# Patient Record
Sex: Female | Born: 1970 | Race: Black or African American | Hispanic: No | Smoking: Never smoker
Health system: Southern US, Community
[De-identification: ages and names within clinical notes are randomized; demographics above are authoritative.]

## PROBLEM LIST (undated history)

## (undated) DIAGNOSIS — F32A Depression, unspecified: Secondary | ICD-10-CM

## (undated) DIAGNOSIS — T7840XA Allergy, unspecified, initial encounter: Secondary | ICD-10-CM

## (undated) DIAGNOSIS — B019 Varicella without complication: Secondary | ICD-10-CM

## (undated) DIAGNOSIS — F329 Major depressive disorder, single episode, unspecified: Secondary | ICD-10-CM

## (undated) HISTORY — DX: Major depressive disorder, single episode, unspecified: F32.9

## (undated) HISTORY — DX: Allergy, unspecified, initial encounter: T78.40XA

## (undated) HISTORY — DX: Varicella without complication: B01.9

## (undated) HISTORY — DX: Depression, unspecified: F32.A

---

## 1997-12-03 ENCOUNTER — Ambulatory Visit (HOSPITAL_COMMUNITY): Admission: RE | Admit: 1997-12-03 | Discharge: 1997-12-03 | Payer: Self-pay | Admitting: Obstetrics and Gynecology

## 1998-07-01 ENCOUNTER — Encounter: Payer: Self-pay | Admitting: Obstetrics and Gynecology

## 1998-07-01 ENCOUNTER — Ambulatory Visit (HOSPITAL_COMMUNITY): Admission: RE | Admit: 1998-07-01 | Discharge: 1998-07-01 | Payer: Self-pay | Admitting: Obstetrics and Gynecology

## 1999-09-15 ENCOUNTER — Other Ambulatory Visit: Admission: RE | Admit: 1999-09-15 | Discharge: 1999-09-15 | Payer: Self-pay | Admitting: *Deleted

## 2000-09-28 ENCOUNTER — Other Ambulatory Visit: Admission: RE | Admit: 2000-09-28 | Discharge: 2000-09-28 | Payer: Self-pay | Admitting: Obstetrics and Gynecology

## 2003-06-13 ENCOUNTER — Other Ambulatory Visit: Admission: RE | Admit: 2003-06-13 | Discharge: 2003-06-13 | Payer: Self-pay | Admitting: Obstetrics and Gynecology

## 2007-06-19 ENCOUNTER — Ambulatory Visit (HOSPITAL_COMMUNITY): Admission: RE | Admit: 2007-06-19 | Discharge: 2007-06-19 | Payer: Self-pay | Admitting: Obstetrics and Gynecology

## 2007-06-19 ENCOUNTER — Encounter (INDEPENDENT_AMBULATORY_CARE_PROVIDER_SITE_OTHER): Payer: Self-pay | Admitting: Obstetrics and Gynecology

## 2010-06-21 ENCOUNTER — Encounter: Payer: Self-pay | Admitting: Internal Medicine

## 2010-10-13 NOTE — Op Note (Signed)
[name], [name]                 ACCOUNT NO.:  [id number]   MEDICAL RECORD NO.:  [id number]          PATIENT TYPE:  AMB   LOCATION:  SDC                           FACILITY:  [facility]   PHYSICIAN:  Dois Davenport A. Robertson, M.D. DATE OF BIRTH:  [birth year]   DATE OF PROCEDURE:  06/19/2007  DATE OF DISCHARGE:                               OPERATIVE REPORT   PREOPERATIVE DIAGNOSIS:  Missed abortion.   POSTOPERATIVE DIAGNOSIS:  Missed abortion.   ANESTHESIA:  IV sedation and paracervical block, Dr. Arby Barrette.   PROCEDURE:  D and E, Dr. Estanislado Pandy.   ESTIMATED BLOOD LOSS:  Minimal.   PROCEDURE:  After being informed of the planned procedure, with possible  complications including bleeding, infection, and injury to uterus,  informed consent was obtained.  The patient was taken to O.R. #7 and  given IV sedation, placed in the lithotomy position.  She is prepped and  draped in a sterile fashion. and her bladder is emptied with an in-and-  out Foley catheter.   GYN exam reveals an anteverted uterus, about 10 weeks in size, and two  normal adnexa.  A speculum is inserted.  Anterior lip of the cervix was  grasped with a tenaculum forceps, and we proceed with a paracervical  block using Novocain 1%. 20 mL, in the usual fashion.  Uterus is sounded  at 8 cm, and the cervix is easily dilated using Hegar dilator until #31,  which allows easy entry of a #8 curved cannula.  We proceed with  evacuation of products of conception without complication.  After  evacuation is completed, a sharp curette is used to assess the uterine  walls which are felt to be free of tissue, including both cornua.  Instruments are then removed.  Instrument and sponge count is complete  x2.  Estimated blood loss is minimal, and the procedure is very well  tolerated by the patient, who is taken to recovery room and discharged  home in a well and stable condition.   SPECIMEN:  Products of conception, sent to pathology.      [name], M.D.  Electronically Signed     SAR/MEDQ  D:  06/19/2007  T:  06/19/2007  Job:  [id number]

## 2010-10-13 NOTE — H&P (Signed)
[name], [name]                 ACCOUNT NO.:  [id number]   MEDICAL RECORD NO.:  [id number]          PATIENT TYPE:  AMB   LOCATION:                                FACILITY:  [facility]   PHYSICIAN:  Crist Fat. Rivard, M.D. DATE OF BIRTH:  16-Mar-1971   DATE OF ADMISSION:  06/19/2007  DATE OF DISCHARGE:                              HISTORY & PHYSICAL   REFERRING PHYSICIAN:  Dois Davenport A. Rivard, M.D.   At the time of this dictation, the patient's medical record number at  [facility] is unknown.   HISTORY:  The patient is a 40 year old married white female,  primigravida, who presents on date of admission for a scheduled D&E  secondary to a missed abortion.  The patient was seen on 06/15/2007 in  the office for her new OB interview, and as well for a new OB workup.  During her new OB interview, she reported that she had not had any  vaginal bleeding, no leakage of fluid.  She had not had any nausea,  vomiting or diarrhea.  She had had some mild cramping, which was  relieved with Tylenol and did report occasional dizziness.  During her  new OB workup, an ultrasound was obtained secondary to patient's history  of PCOS, dysfunctional uterine bleeding and irregular cycles to confirm  dating of pregnancy and the ultrasound showed no fetal heart tones with  a crown-rump length measuring 8 and 1/[redacted] weeks gestation.  Per patient's  recall, her last menstrual period was 04/09/2007, which gave her a due  date of 01/16/2008 and at the time on January 15th, she was supposed to  be approximately 9 and 4/[redacted] weeks gestation.  Prenatal labs had been  drawn, but were all cancelled with the exception of ABO, RH and a CBC.  The results of her ultrasound were discussed with patient and her  husband, and her options regarding missed abortion management including  expected management, medical induction utilizing Cytotec and a D&E were  discussed with the patient.  Risks, benefits and alternatives were  reviewed  in depth and after conversation the patient did desire to  proceed with a D&E with Dois Davenport A. Rivard, M.D.   PAST MEDICAL HISTORY:  The patient's history is remarkable for:  1. PCOS.  2. Irregular cycles with amenorrhea and dysfunctional uterine      bleeding.  3. History of cryosurgery in early 1990s with subsequent normal pap      smears.  4. Irritable bowel syndrome with constipation primarily.  5. She has had multiple yeast infections.  6. To note, her mother passed away of breast cancer at the age of 32.   PRENATAL LABS:  As was mentioned, her labs were cancelled with the  exception of ABO, RH and a CBC, and both remain pending at the time of  this dictation.   OBSTETRIC HISTORY:  The patient is a primigravida.   MEDICAL HISTORY:  The patient reports the use of birth control pills as  well as condoms for contraception in the past.  As was noted, she has a  history  of PCOS, irregular cycles with amenorrhea and dysfunctional  uterine bleeding, and has most recently been utilizing Provera for  induction of menses.  She did report, at the time of her new OB visit,  that her last cycle prior to conception, she did not necessitate Provera  for onset of menses.  She reports cryosurgery in the early 1990s, and  her last Pap smear was June of 2008, and was normal.  She has had  multiple yeast infections in the past.  She reports normal childhood  illnesses including chickenpox as a child, and feels certain per her  history of receiving Hepatitis B vaccines.  She has irritable bowel  syndrome with primarily constipation.   SURGICAL HISTORY:  Cryosurgery.   FAMILY HISTORY:  She reports her maternal grandmother and her three  brothers all have high cholesterol.  She has a paternal uncle with  chronic hypertension, a maternal grandfather with diabetes, maternal  grandmother with thyroid dysfunction, paternal grandmother lung cancer,  father with prostate cancer.  She reports a first  cousin who has chronic  renal disease and is on dialysis.  Maternal grandmother and maternal  grandfather with dementia.  She did report that her mother had breast  cancer before menopause.  She was diagnosed sometime between age 62-38  and passed away subsequently at age 8.  The patient did report a  mammogram at approximately 40 years of age, which was within normal  limits.   GENETIC HISTORY:  Unremarkable with the exception of the patient is 40  years of age, which is considered advanced maternal age.  The father of  the baby is 69 years of age.   ALLERGIES:  The patient does report a latex allergy, which causes  breakouts.  She also reports some metal allergies which also cause a  rash and breakout.  She denied any medication allergies.   SOCIAL HISTORY:  The patient is married black female.  She and her  husband are of [place] faith.  The patient has a Oncologist and  works at [employer] as a Psychologist, sport and exercise time.  Her husband has a  high school degree and works as an Designer, television/film set.  She denied any alcohol,  tobacco, or illicit drug use.   PHYSICAL EXAMINATION:  VITAL SIGNS:  Stable. Patient's blood pressure on  January 15th was 100/60, she was afebrile, her pulse was 60 as well.  Her height is 5 foot 1-1/2 inches, weight on January 15th was 125  pounds.  She has a normal body mass index.  SKIN:  Within normal limits, it was warm, dry and intact.  HEENT:  Grossly intact and within normal limits.  NECK/THYROID:  Normal.  HEART:  Regular rate and rhythm without murmur.  LUNGS:  Clear to auscultation bilaterally.  BREASTS:  Slightly tender, soft, no abnormal discharge or palpable  masses.  ABDOMEN:  Soft and nontender, no organomegaly.  PELVIC:  External genitalia without lesions or abnormalities.  BIMANUAL:  Cervix was closed and long, minimal discharge noted, no  bleeding, no internal lesions noted.  Uterus was approximately nine  weeks size on palpation, unable  to palpate adnexa.  RECTAL:  Deferred.  EXTREMITIES:  Deep tendon reflexes are 2+ without clonus.  Patient  without edema.   IMPRESSION:  1. Missed ab.  2. Patient desiring D&E for management of missed ab.  3. Pending ABO, RH and CBC.   PLAN:  1. Admit to [facility] for consult with Crist Fat.  Rivard, M.D. as attending physician on date of admission.  2. Routine physician preoperative orders.  3. Risks and benefits of dilatation and evacuation were reviewed with      the patient by Dois Davenport A. Rivard, M.D. and the patient does desire      to proceed with  procedure for management of missed ab.  4. Support offered p.r.n.      Candice Rexford, CNM      Dois Davenport A. Rivard, M.D.  Electronically Signed    CHS/MEDQ  D:  06/16/2007  T:  06/16/2007  Job:  [id number]

## 2010-12-18 ENCOUNTER — Other Ambulatory Visit: Payer: Self-pay | Admitting: Obstetrics and Gynecology

## 2010-12-18 DIAGNOSIS — Z1231 Encounter for screening mammogram for malignant neoplasm of breast: Secondary | ICD-10-CM

## 2010-12-22 ENCOUNTER — Ambulatory Visit
Admission: RE | Admit: 2010-12-22 | Discharge: 2010-12-22 | Disposition: A | Discharge status: Home / Self Care | Payer: BC Managed Care – PPO | Source: Ambulatory Visit | Attending: Obstetrics and Gynecology | Admitting: Obstetrics and Gynecology

## 2010-12-22 DIAGNOSIS — Z1231 Encounter for screening mammogram for malignant neoplasm of breast: Secondary | ICD-10-CM

## 2011-02-19 LAB — CBC
HCT: 40.6
Hemoglobin: 14.5
MCHC: 35.8
MCV: 94.1
Platelets: 258
RBC: 4.31
RDW: 13.4
WBC: 4.3

## 2013-03-15 LAB — HM PAP SMEAR: HM PAP: NORMAL

## 2014-02-26 ENCOUNTER — Other Ambulatory Visit: Payer: Self-pay

## 2014-02-26 DIAGNOSIS — Z1231 Encounter for screening mammogram for malignant neoplasm of breast: Secondary | ICD-10-CM

## 2014-03-12 ENCOUNTER — Encounter (INDEPENDENT_AMBULATORY_CARE_PROVIDER_SITE_OTHER): Payer: Self-pay

## 2014-03-12 ENCOUNTER — Ambulatory Visit
Admission: RE | Admit: 2014-03-12 | Discharge: 2014-03-12 | Disposition: A | Discharge status: Home / Self Care | Payer: 59 | Source: Ambulatory Visit

## 2014-03-12 DIAGNOSIS — Z1231 Encounter for screening mammogram for malignant neoplasm of breast: Secondary | ICD-10-CM

## 2014-03-15 ENCOUNTER — Ambulatory Visit: Payer: BC Managed Care – PPO | Admitting: Family Medicine

## 2014-03-15 ENCOUNTER — Ambulatory Visit (INDEPENDENT_AMBULATORY_CARE_PROVIDER_SITE_OTHER): Payer: 59 | Admitting: Family Medicine

## 2014-03-15 ENCOUNTER — Encounter: Payer: Self-pay | Admitting: Family Medicine

## 2014-03-15 VITALS — BP 110/80 | Temp 98.1°F | Ht 62.0 in | Wt 144.0 lb

## 2014-03-15 DIAGNOSIS — M549 Dorsalgia, unspecified: Secondary | ICD-10-CM | POA: Insufficient documentation

## 2014-03-15 DIAGNOSIS — Z0000 Encounter for general adult medical examination without abnormal findings: Secondary | ICD-10-CM

## 2014-03-15 DIAGNOSIS — F32A Depression, unspecified: Secondary | ICD-10-CM

## 2014-03-15 DIAGNOSIS — Z7689 Persons encountering health services in other specified circumstances: Secondary | ICD-10-CM

## 2014-03-15 DIAGNOSIS — M546 Pain in thoracic spine: Secondary | ICD-10-CM

## 2014-03-15 DIAGNOSIS — F329 Major depressive disorder, single episode, unspecified: Secondary | ICD-10-CM | POA: Insufficient documentation

## 2014-03-15 DIAGNOSIS — Z7189 Other specified counseling: Secondary | ICD-10-CM

## 2014-03-15 MED ORDER — TIZANIDINE HCL 2 MG PO TABS: 2.0000 mg | ORAL_TABLET | Freq: Every day | ORAL | Status: DC

## 2014-03-15 NOTE — Patient Instructions (Signed)
-  get counseling  -schedule lab appointment for fasting labs  -do the exercises at least 4 days per week for the back  -consider massage therapy  -heat for 15 minutes twice daily   -muscle relaxer at night for a few days  -We have ordered labs or studies at this visit. It can take up to 1-2 weeks for results and processing. We will contact you with instructions IF your results are abnormal. Normal results will be released to your [facility]. If you have not heard from us or can not find your results in [facility] in 2 weeks please contact our office.  -PLEASE SIGN UP FOR MYCHART TODAY   We recommend the following healthy lifestyle measures: - eat a healthy diet consisting of lots of vegetables, fruits, beans, nuts, seeds, healthy meats such as white chicken and fish and whole grains.  - avoid fried foods, fast food, processed foods, sodas, red meet and other fattening foods.  - get a least 150 minutes of aerobic exercise per week.   Follow up in: 6 weeks or sooner if needed

## 2014-03-15 NOTE — Progress Notes (Signed)
Pre visit review using our clinic review tool, if applicable. No additional management support is needed unless otherwise documented below in the visit note. 

## 2014-03-15 NOTE — Progress Notes (Signed)
Chief Complaint  Patient presents with  . Establish Care    HPI:  [name] is here to establish care. Needs preventive visit here and labs for work. Last PCP and physical: [facility]; UTD on physical  Has the following chronic problems and concerns today:  Patient Active Problem List   Diagnosis Date Noted  . Depression 03/15/2014  . Upper back pain on right side 03/15/2014   R upper back and neck pain: -started bout 2 weeks ago -can't think of inciting event, but thinks slept wrong -aleve resolved it, but recurring -pain is intermittent, achy, 6/10 -worse with tension and lifting shoulders, not really better with anything -denies: weakness, numbness  Depression: Depressed mood for > 6 months - stress and home and at work; no abuse, feels safe Depression Symptoms: Sleep disorder: no Interest deficit/anhedonia: yes Guilt (worthlessness, hopelessness, regret):sometimes Energy deficit: yes Concentration deficit:yes Appetite disorder: no Psychomotor retardation or agitation: yes Suicidality: no  Doing pap, mammo breast health with gyn. Mother with breast cancer - followed by gyn for this. She has discussed genetic counseling with gyn - she is considering.  ROS negative for unless reported above: fevers, unintentional weight loss, hearing or vision loss, chest pain, palpitations, struggling to breath, hemoptysis, melena, hematochezia, hematuria, falls, loc, si, thoughts of self harm  Past Medical History  Diagnosis Date  . Chicken pox   . Depression     no medications in the past  . Allergy     History reviewed. No pertinent past surgical history.  Family History  Problem Relation Age of Onset  . Cancer Mother [id number]    breast cancer  . Cancer Father     prostate    History   Social History  . Marital Status: Married    Spouse Name: N/A    Number of Children: N/A  . Years of Education: N/A   Social History Main Topics  . Smoking  status: Never Smoker   . Smokeless tobacco: None  . Alcohol Use: No  . Drug Use: No  . Sexual Activity: None   Other Topics Concern  . 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  GENERAL: vitals reviewed and listed above, alert, oriented, appears well hydrated and in no acute distress  HEENT: atraumatic, conjunttiva clear, no obvious abnormalities on inspection of external nose and ears  NECK: no obvious masses on inspection  LUNGS: clear to auscultation bilaterally, no wheezes, rales or rhonchi, good air movement  CV: HRRR, no peripheral edema  GU/BREAST: declined - does with gyn  MS: moves all extremities without noticeable abnormality Normal inspection of head, neck and shoulders with normal ROM of head and neck. TTP and tension in R trap and  sub occipital muscles Nep spurling, no bony TTP, normal UE strength and sensation  PSYCH: pleasant and cooperative, depressed mood  ASSESSMENT AND PLAN:  Discussed the following assessment and plan:  Visit for preventive health examination -all USPSTF rec level a and b discussed -gyn and breast health is done with her gynecologist -fasting lab orders placed  Upper back pain on right side - Plan: Lipid Panel, Hemoglobin A1c, Basic metabolic panel, tiZANidine (ZANAFLEX) 2 MG tablet -we discussed possible serious and likely etiologies, workup and treatment,  treatment risks and return precautions -after this discussion, [name] opted for HEP and conservative care -follow up advised in 4-6 weeks -of course, we advised [name] or notify a doctor immediately if symptoms worsen or persist or new concerns arise.  Depression- discussed tx, she does not want to take a medication, start with counseling and exercise, follow up in 4-6 weeks or sooner if worsening or concerns  Encounter to establish care -We reviewed the PMH, PSH, FH, SH, Meds and Allergies. -We provided refills for any medications we will prescribe as needed. -We addressed current concerns per orders and patient instructions. -We have asked for records for pertinent exams, studies, vaccines and notes from previous providers. -We have advised patient to follow up per instructions below.   -Patient advised to return or notify a doctor immediately if symptoms worsen or persist or new concerns arise.  Patient Instructions  -get counseling  -schedule lab appointment for fasting labs  -do the exercises at least 4 days per week for the back  -consider massage therapy  -heat for 15 minutes twice daily   -muscle relaxer at night for a few days  -We have ordered labs or studies at this visit. It can take up to 1-2 weeks for results and processing. We will contact you with instructions IF your results are abnormal. Normal results will be released to your [facility]. If you have not heard from us or can not find your results in [facility] in 2 weeks please contact our office.  -PLEASE SIGN UP FOR MYCHART TODAY   We recommend the following healthy lifestyle measures: - eat a healthy diet consisting of lots of vegetables, fruits, beans, nuts, seeds, healthy meats such as white chicken and fish and whole grains.  - avoid fried foods, fast food, processed foods, sodas, red meet and other fattening foods.  - get a least 150 minutes of aerobic exercise per week.   Follow up in: 6 weeks or  sooner if needed      Duff Pozzi R.

## 2014-03-19 ENCOUNTER — Other Ambulatory Visit (INDEPENDENT_AMBULATORY_CARE_PROVIDER_SITE_OTHER): Payer: 59

## 2014-03-19 ENCOUNTER — Telehealth: Payer: Self-pay | Admitting: Family Medicine

## 2014-03-19 DIAGNOSIS — M549 Dorsalgia, unspecified: Secondary | ICD-10-CM

## 2014-03-19 DIAGNOSIS — M546 Pain in thoracic spine: Secondary | ICD-10-CM

## 2014-03-19 LAB — BASIC METABOLIC PANEL
BUN: 11 mg/dL (ref 6–23)
CHLORIDE: 105 meq/L (ref 96–112)
CO2: 22 mEq/L (ref 19–32)
Calcium: 9.1 mg/dL (ref 8.4–10.5)
Creatinine, Ser: 0.7 mg/dL (ref 0.4–1.2)
GFR: 119.91 mL/min (ref >60.00)
Glucose, Bld: 85 mg/dL (ref 70–99)
POTASSIUM: 4.6 meq/L (ref 3.5–5.1)
SODIUM: 140 meq/L (ref 135–145)

## 2014-03-19 LAB — LIPID PANEL
CHOL/HDL RATIO: 5
Cholesterol: 191 mg/dL (ref 0–200)
HDL: 38.6 mg/dL — AB (ref >39.00)
LDL Cholesterol: 142 mg/dL — ABNORMAL HIGH (ref 0–99)
NonHDL: 152.4
Triglycerides: 54 mg/dL (ref 0.0–149.0)
VLDL: 10.8 mg/dL (ref 0.0–40.0)

## 2014-03-19 LAB — HEMOGLOBIN A1C: Hgb A1c MFr Bld: 5.4 % (ref 4.6–6.5)

## 2014-03-19 MED ORDER — NAPROXEN 500 MG PO TABS: 500.0000 mg | ORAL_TABLET | Freq: Two times a day (BID) | ORAL | Status: DC

## 2014-03-19 NOTE — Telephone Encounter (Signed)
Patient came in this morning and stated that the prescription (Flexril) she received on Friday was not strong enough for her. Patient [facility] to know [facility] [address]

## 2014-03-19 NOTE — Telephone Encounter (Signed)
This type of pain usually take several weeks to resolve with the exercises. Can try tylenol 500-1000mg  up to 3 times daily or can rx naproxen 500mg  bid to try if she wants. Along with heat and topical sports creams this can help. The muscle relaxer is more to help with the muscle spasm to take at night before bed.

## 2014-03-19 NOTE — Telephone Encounter (Signed)
Patient informed of the message below and requests to take Naproxen and she is aware this was sent to her pharmacy.

## 2014-03-19 NOTE — Telephone Encounter (Signed)
Pt is calling back concerning flexril

## 2014-03-22 ENCOUNTER — Encounter: Payer: Self-pay | Admitting: *Deleted

## 2014-04-01 ENCOUNTER — Encounter: Payer: Self-pay | Admitting: Family Medicine

## 2014-05-15 ENCOUNTER — Emergency Department (HOSPITAL_COMMUNITY)
Admission: EM | Admit: 2014-05-15 | Discharge: 2014-05-15 | Discharge status: Absent without leave | Payer: 59 | Source: Home / Self Care

## 2016-04-06 ENCOUNTER — Other Ambulatory Visit: Payer: Self-pay | Admitting: Obstetrics and Gynecology

## 2016-04-06 DIAGNOSIS — Z1231 Encounter for screening mammogram for malignant neoplasm of breast: Secondary | ICD-10-CM

## 2016-04-08 ENCOUNTER — Ambulatory Visit
Admission: RE | Admit: 2016-04-08 | Discharge: 2016-04-08 | Disposition: A | Discharge status: Home / Self Care | Payer: Commercial Managed Care - PPO | Source: Ambulatory Visit | Attending: Obstetrics and Gynecology | Admitting: Obstetrics and Gynecology

## 2016-04-08 DIAGNOSIS — Z1231 Encounter for screening mammogram for malignant neoplasm of breast: Secondary | ICD-10-CM

## 2016-04-09 ENCOUNTER — Other Ambulatory Visit: Payer: Self-pay | Admitting: Obstetrics and Gynecology

## 2016-04-09 DIAGNOSIS — N63 Unspecified lump in unspecified breast: Secondary | ICD-10-CM

## 2016-04-20 ENCOUNTER — Ambulatory Visit
Admission: RE | Admit: 2016-04-20 | Discharge: 2016-04-20 | Disposition: A | Discharge status: Home / Self Care | Payer: Commercial Managed Care - PPO | Source: Ambulatory Visit | Attending: Obstetrics and Gynecology | Admitting: Obstetrics and Gynecology

## 2016-04-20 DIAGNOSIS — N63 Unspecified lump in unspecified breast: Secondary | ICD-10-CM

## 2018-02-12 ENCOUNTER — Encounter (HOSPITAL_COMMUNITY): Payer: Self-pay | Admitting: Emergency Medicine

## 2018-02-12 ENCOUNTER — Ambulatory Visit (HOSPITAL_COMMUNITY)
Admission: EM | Admit: 2018-02-12 | Discharge: 2018-02-12 | Disposition: A | Discharge status: Home / Self Care | Payer: Commercial Managed Care - PPO | Attending: Family Medicine | Admitting: Family Medicine

## 2018-02-12 ENCOUNTER — Other Ambulatory Visit: Payer: Self-pay

## 2018-02-12 DIAGNOSIS — L309 Dermatitis, unspecified: Secondary | ICD-10-CM

## 2018-02-12 MED ORDER — HYDROXYZINE HCL 25 MG PO TABS: 25.0000 mg | ORAL_TABLET | Freq: Four times a day (QID) | ORAL | 0 refills | Status: DC

## 2018-02-12 MED ORDER — METHYLPREDNISOLONE SODIUM SUCC 125 MG IJ SOLR
125.0000 mg | Freq: Once | INTRAMUSCULAR | Status: AC
  Administered 2018-02-12: 125 mg via INTRAMUSCULAR

## 2018-02-12 MED ORDER — PREDNISONE 20 MG PO TABS: ORAL_TABLET | ORAL | 0 refills | Status: DC

## 2018-02-12 MED ORDER — METHYLPREDNISOLONE SODIUM SUCC 125 MG IJ SOLR
INTRAMUSCULAR | Status: AC
  Filled 2018-02-12: qty 2

## 2018-02-12 MED ORDER — CLOBETASOL PROPIONATE 0.05 % EX OINT: 1.0000 "application " | TOPICAL_OINTMENT | Freq: Two times a day (BID) | CUTANEOUS | 0 refills | Status: DC

## 2018-02-12 NOTE — Discharge Instructions (Addendum)
I have placed you on a prednisone taper.  Avoid taking any naproxen and/or ibuprofen while taking prednisone.  I have also prescribed you hydroxyzine 25 mg may take as needed up to 3 times a day for itching.  Caution medication causes drowsiness. For topical therapy, I have prescribed clobetasol ointment twice daily to areas of irritation.

## 2018-02-12 NOTE — ED Triage Notes (Signed)
Pt reports recurrent eczema and this last flare up occurred in July and she is unable to treat it at home like she usually does.

## 2018-02-12 NOTE — ED Notes (Signed)
Pt complains of ringing in her ears and feeling very hot.  Joaquin CourtsKimberly Harris, NP notified and went to the pt's room.  Pt was given a cup of ice and she is sitting comfortably in the chair.  Pt states she is already feeling better.  WE will keep the pt for 5-10 more minutes and retake her BP before d/c.

## 2018-02-12 NOTE — ED Provider Notes (Addendum)
[facility]    CSN: [id number] Arrival date & time: 02/12/18  1520     History   Chief Complaint Chief Complaint  Patient presents with  . Eczema   HPI [name] is a 47 y.o. female.   Eczema Flare-up Patient presents with a known history of well-controlled eczema, is currently having an active flare affecting bilateral extremities, back, and lower extremities. She describes skin as rough, plaque-like lesions, pruritic lesions which have erupted on the aforementioned locations of her body.  She has attempted relief with her previously prescribed topical cream without significant relief.  Think she was previously prescribed betamethasone topical steroidal cream. In spite of application she continues to itch and rash continues to erupt on various parts of her body. Past Medical History:  Diagnosis Date  . Allergy   . Chicken pox   . Depression    no medications in the past    Patient Active Problem List   Diagnosis Date Noted  . Depression 03/15/2014  . Upper back pain on right side 03/15/2014    History reviewed. No pertinent surgical history.  OB History    Gravida  1   Para  0   Term      Preterm      AB      Living        SAB      TAB      Ectopic      Multiple      Live Births               Home Medications    Prior to Admission medications   Medication Sig Start Date End Date Taking? Authorizing Provider  naproxen (NAPROSYN) 500 MG tablet Take 1 tablet (500 mg total) by mouth 2 (two) times daily with a meal. 03/19/14   Terressa Koyanagi, DO  naproxen sodium (ANAPROX) 220 MG tablet Take 220 mg by mouth 2 (two) times daily with a meal.    [provider]  tiZANidine (ZANAFLEX) 2 MG tablet Take 1 tablet (2 mg total) by mouth at bedtime. 03/15/14   Terressa Koyanagi, DO    Family History Family History  Problem Relation Age of Onset  . Cancer Mother 11       breast cancer  . Cancer Father        prostate    Social  History Social History   Tobacco Use  . Smoking status: Never Smoker  . Smokeless tobacco: Never Used  Substance Use Topics  . Alcohol use: No  . Drug use: No     Allergies   Patient has no known allergies.   Review of Systems Review of Systems Pertinent negatives listed in HPI Physical Exam Triage Vital Signs ED Triage Vitals  Enc Vitals Group     BP 02/12/18 1555 112/70     Pulse Rate 02/12/18 1555 81     Resp --      Temp 02/12/18 1555 98.5 F (36.9 C)     Temp Source 02/12/18 1555 Oral     SpO2 02/12/18 1555 100 %     Weight --      Height --      Head Circumference --      Peak Flow --      Pain Score 02/12/18 1556 0     Pain Loc --      Pain Edu? --      Excl. in GC? --  No data found.  Updated Vital Signs BP 112/70 (BP Location: Left Arm)   Pulse 81   Temp 98.5 F (36.9 C) (Oral)   LMP 01/22/2018   SpO2 100%   Visual Acuity Right Eye Distance:   Left Eye Distance:   Bilateral Distance:    Right Eye Near:   Left Eye Near:    Bilateral Near:     Physical Exam   UC Treatments / Results  Labs (all labs ordered are listed, but only abnormal results are displayed) Labs Reviewed - No data to display  EKG None  Radiology No results found.  Procedures Procedures (including critical care time)  Medications Ordered in UC Medications  methylPREDNISolone sodium succinate (SOLU-MEDROL) 125 mg/2 mL injection 125 mg (125 mg Intramuscular Given 02/12/18 1709)   Initial Impression / Assessment and Plan / UC Course  I have reviewed the triage vital signs and the nursing notes.  Pertinent labs & imaging results that were available during my care of the patient were reviewed by me and considered in my medical decision making (see chart for details).    Patient presents today with an acute eczema exacerbation.  Eczema most of the time is very well controlled on reports her last exacerbation was well over 2 years ago.  No recent changes or  stressors to her knowledge.  He has tried to resolve current exacerbation at home with an older prescription of betamethasone which has not to be effective.  Therefore I will opt to treat her with an oral prednisone taper in which she will start tomorrow.  In office today I will opt to give her Solu-Medrol 125 mg IM facilitate decrease in current episode of inflammation.  She is also being prescribed hydroxyzine 25 mg up to 3 times a day as needed (caution provided regarding sick date of effects of medication).  She did not receive adequate relief with use of topical  Betamethasone, therefore will trial clobetasol topical therapy twice daily x10 days.  Advised if current eruption did not solve effectively to follow-up with PCP for further treatment and/or a referral to dermatology.  Patient verbalized understanding and agreement with plan.   After receiving Solu-Medrol 125 IM patient reported that she experienced hot flushing sensation and tinnitus. Vital signs were taken immediately, patient evaluated by me. There was no evidence of an allergic reaction or any distress. Patient was given ice and offered hydration however declined hydration and opted for ice chips. She was asked to remain in office an additional 20 minutes and no longer experienced any symptoms.  She was discharged stable without distress. Final Clinical Impressions(s) / UC Diagnoses   Final diagnoses:  Eczema, unspecified type     Discharge Instructions     I have placed you on a prednisone taper.  Avoid taking any naproxen and/or ibuprofen while taking prednisone.  I have also prescribed you hydroxyzine 25 mg may take as needed up to 3 times a day for itching.  Caution medication causes drowsiness. For topical therapy, I have prescribed clobetasol ointment twice daily to areas of irritation.    ED Prescriptions    Medication Sig Dispense Auth. Provider   predniSONE (DELTASONE) 20 MG tablet Take 3 PO QAM x3days, 2 PO QAM x3days,  1 PO QAM x3days 18 tablet Bing NeighborsHarris, Darcy Cordner S, FNP   hydrOXYzine (ATARAX/VISTARIL) 25 MG tablet Take 1 tablet (25 mg total) by mouth every 6 (six) hours. 12 tablet Bing NeighborsHarris, Dimitra Woodstock S, FNP   clobetasol ointment (TEMOVATE) 0.05 %  Apply 1 application topically 2 (two) times daily. 120 g Bing Neighbors, FNP     Controlled Substance Prescriptions [place] Controlled Substance Registry consulted? Not Applicable   Bing Neighbors, FNP 02/13/18 1520    Bing Neighbors, [place] 02/13/18 1521

## 2018-02-15 ENCOUNTER — Telehealth (HOSPITAL_COMMUNITY): Payer: Self-pay | Admitting: Family Medicine

## 2018-02-15 MED ORDER — FLUOCINONIDE-E 0.05 % EX CREA: 1.0000 "application " | TOPICAL_CREAM | Freq: Two times a day (BID) | CUTANEOUS | 0 refills | Status: DC

## 2018-02-15 NOTE — Telephone Encounter (Signed)
Rx given at last visit too expensive. Requests alternative.  Meds ordered this encounter  Medications  . fluocinonide-emollient (LIDEX-E) 0.05 % cream    Sig: Apply 1 application topically 2 (two) times daily.    Dispense:  60 g    Refill:  0   Will let us know if this is too expensive.

## 2020-04-02 ENCOUNTER — Encounter (HOSPITAL_BASED_OUTPATIENT_CLINIC_OR_DEPARTMENT_OTHER): Payer: Self-pay

## 2020-04-02 ENCOUNTER — Other Ambulatory Visit: Payer: Self-pay

## 2020-04-02 ENCOUNTER — Emergency Department (HOSPITAL_BASED_OUTPATIENT_CLINIC_OR_DEPARTMENT_OTHER)

## 2020-04-02 ENCOUNTER — Emergency Department (HOSPITAL_BASED_OUTPATIENT_CLINIC_OR_DEPARTMENT_OTHER)
Admission: EM | Admit: 2020-04-02 | Discharge: 2020-04-02 | Disposition: A | Discharge status: Home / Self Care | Attending: Emergency Medicine | Admitting: Emergency Medicine

## 2020-04-02 DIAGNOSIS — Y9389 Activity, other specified: Secondary | ICD-10-CM | POA: Diagnosis not present

## 2020-04-02 DIAGNOSIS — M791 Myalgia, unspecified site: Secondary | ICD-10-CM | POA: Insufficient documentation

## 2020-04-02 DIAGNOSIS — Y92410 Unspecified street and highway as the place of occurrence of the external cause: Secondary | ICD-10-CM | POA: Diagnosis not present

## 2020-04-02 DIAGNOSIS — R0602 Shortness of breath: Secondary | ICD-10-CM | POA: Insufficient documentation

## 2020-04-02 DIAGNOSIS — M7918 Myalgia, other site: Secondary | ICD-10-CM

## 2020-04-02 MED ORDER — METHOCARBAMOL 500 MG PO TABS: 500.0000 mg | ORAL_TABLET | Freq: Two times a day (BID) | ORAL | 0 refills | Status: DC

## 2020-04-02 MED ORDER — NAPROXEN 500 MG PO TABS: 500.0000 mg | ORAL_TABLET | Freq: Two times a day (BID) | ORAL | 0 refills | Status: DC
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
## 2020-04-02 NOTE — Discharge Instructions (Addendum)
Your imaging was reassuring today without signs of fractures or other abnormalities.  I have provided you with an antiinflammatory and a muscle relaxer to help with your pain. DO NOT DRIVE WHILE ON THE MUSCLE RELAXER AS IT CAN MAKE YOU DROWSY. I would recommend taking the muscle relaxer at nighttime to help you sleep.  Please rest, ice, and elevate your hands, foot, and knee to help reduce pain/inflammation Follow up with your PCP regarding your ED visit today and especially if your shortness of breath continues Return to the ED IMMEDIATELY for any worsening symptoms including worsening shortness of breath, fevers > 100.4, chest pain, or any other new/concerning symptoms

## 2020-04-02 NOTE — ED Notes (Signed)
Review D/C papers with pt, reviewed Rx with pt, pt states understanding, pt denies questions at this time. 

## 2020-04-02 NOTE — ED Notes (Signed)
Pt on monitor 
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
## 2021-11-06 ENCOUNTER — Other Ambulatory Visit: Payer: Self-pay | Admitting: Obstetrics and Gynecology

## 2021-11-06 DIAGNOSIS — R928 Other abnormal and inconclusive findings on diagnostic imaging of breast: Secondary | ICD-10-CM

## 2021-11-16 ENCOUNTER — Ambulatory Visit
Admission: RE | Admit: 2021-11-16 | Discharge: 2021-11-16 | Disposition: A | Discharge status: Home / Self Care | Source: Ambulatory Visit | Attending: Obstetrics and Gynecology | Admitting: Obstetrics and Gynecology

## 2021-11-16 ENCOUNTER — Ambulatory Visit

## 2021-11-16 DIAGNOSIS — R928 Other abnormal and inconclusive findings on diagnostic imaging of breast: Secondary | ICD-10-CM

## 2021-11-23 ENCOUNTER — Other Ambulatory Visit

## 2021-11-23 ENCOUNTER — Encounter

## 2021-11-30 ENCOUNTER — Ambulatory Visit
Admission: EM | Admit: 2021-11-30 | Discharge: 2021-11-30 | Disposition: A | Discharge status: Home / Self Care | Attending: Physician Assistant | Admitting: Physician Assistant

## 2021-11-30 DIAGNOSIS — R051 Acute cough: Secondary | ICD-10-CM | POA: Diagnosis not present

## 2021-11-30 DIAGNOSIS — J069 Acute upper respiratory infection, unspecified: Secondary | ICD-10-CM

## 2021-11-30 MED ORDER — PROMETHAZINE-DM 6.25-15 MG/5ML PO SYRP: 5.0000 mL | ORAL_SOLUTION | Freq: Every evening | ORAL | 0 refills | Status: DC | PRN

## 2021-11-30 NOTE — ED Provider Notes (Signed)
[facility]    CSN: [id number] Arrival date & time: 11/30/21  1559      History   Chief Complaint Chief Complaint  Patient presents with   Cough   Nasal Congestion    HPI [name] is a 51 y.o. female presenting for 8-day history of cough, congestion, postnasal drainage, sinus pressure.  Denies fever, fatigue, chest pain, shortness of breath, weakness.  Reports sore throat has resolved and most of her symptoms have gotten better but her cough is lingering and it is worse at night.  Reports getting into a lot of coughing fits and feeling short of breath during a coughing fit but denies any shortness of breath on exertion or otherwise.  Has been taking Alka-Seltzer and using NyQuil.  Patient states that she wants to be checked out because she normally does not have a cold last this long.  No other complaints.  HPI  Past Medical History:  Diagnosis Date   Allergy    Chicken pox    Depression    no medications in the past    Patient Active Problem List   Diagnosis Date Noted   Depression 03/15/2014   Upper back pain on right side 03/15/2014    History reviewed. No pertinent surgical history.  OB History     Gravida  1   Victoria  0   Term      Preterm      AB      Living         SAB      IAB      Ectopic      Multiple      Live Births               Home Medications    Prior to Admission medications   Medication Sig Start Date End Date Taking? Authorizing Provider  clobetasol ointment (TEMOVATE) 0.05 % Apply 1 application topically 2 (two) times daily. 02/12/18  Yes Bing Neighbors, FNP  fluocinonide-emollient (LIDEX-E) 0.05 % cream Apply 1 application topically 2 (two) times daily. 02/15/18  Yes Mardella Layman, MD  hydrOXYzine (ATARAX/VISTARIL) 25 MG tablet Take 1 tablet (25 mg total) by mouth every 6 (six) hours. 02/12/18  Yes Bing Neighbors, FNP  methocarbamol (ROBAXIN) 500 MG tablet Take 1 tablet (500 mg total) by mouth 2 (two)  times daily. 04/02/20  Yes Venter, Margaux, PA-C  naproxen (NAPROSYN) 500 MG tablet Take 1 tablet (500 mg total) by mouth 2 (two) times daily with a meal. 03/19/14  Yes Kriste Basque R, DO  naproxen (NAPROSYN) 500 MG tablet Take 1 tablet (500 mg total) by mouth 2 (two) times daily. 04/02/20  Yes Venter, Margaux, PA-C  naproxen sodium (ANAPROX) 220 MG tablet Take 220 mg by mouth 2 (two) times daily with a meal.   Yes [provider]  predniSONE (DELTASONE) 20 MG tablet Take 3 PO QAM x3days, 2 PO QAM x3days, 1 PO QAM x3days 02/12/18  Yes Bing Neighbors, FNP  promethazine-dextromethorphan (PROMETHAZINE-DM) 6.25-15 MG/5ML syrup Take 5 mLs by mouth at bedtime as needed. 11/30/21  Yes Eusebio Friendly B, PA-C  tiZANidine (ZANAFLEX) 2 MG tablet Take 1 tablet (2 mg total) by mouth at bedtime. 03/15/14  Yes Terressa Koyanagi, DO    Family History Family History  Problem Relation Age of Onset   Cancer Mother 23       breast cancer   Cancer Father        prostate  Social History Social History   Tobacco Use   Smoking status: Never   Smokeless tobacco: Never  Vaping Use   Vaping Use: Never used  Substance Use Topics   Alcohol use: No   Drug use: No     Allergies   Latex   Review of Systems Review of Systems  Constitutional:  Negative for chills, diaphoresis, fatigue and fever.  HENT:  Positive for congestion, ear pain, postnasal drip, rhinorrhea and sinus pressure. Negative for sinus pain and sore throat.   Respiratory:  Positive for cough. Negative for shortness of breath.   Cardiovascular:  Negative for chest pain.  Gastrointestinal:  Negative for abdominal pain, nausea and vomiting.  Musculoskeletal:  Negative for arthralgias and myalgias.  Skin:  Negative for rash.  Neurological:  Negative for weakness and headaches.  Hematological:  Negative for adenopathy.     Physical Exam Triage Vital Signs ED Triage Vitals  Enc Vitals Group     BP 11/30/21 1616 110/74     Pulse  Rate 11/30/21 1616 83     Resp 11/30/21 1616 18     Temp 11/30/21 1616 98.2 F (36.8 C)     Temp Source 11/30/21 1616 Oral     SpO2 11/30/21 1616 96 %     Weight 11/30/21 1614 115 lb (52.2 kg)     Height 11/30/21 1614 5\' 2"  (1.575 m)     Head Circumference --      Peak Flow --      Pain Score 11/30/21 1614 5     Pain Loc --      Pain Edu? --      Excl. in GC? --    No data found.  Updated Vital Signs BP 110/74 (BP Location: Left Arm)   Pulse 83   Temp 98.2 F (36.8 C) (Oral)   Resp 18   Ht 5\' 2"  (1.575 m)   Wt 115 lb (52.2 kg)   LMP 11/23/2021   SpO2 96%   BMI 21.03 kg/m      Physical Exam Vitals and nursing note reviewed.  Constitutional:      General: She is not in acute distress.    Appearance: Normal appearance. She is not ill-appearing or toxic-appearing.  HENT:     Head: Normocephalic and atraumatic.     Right Ear: Hearing, ear canal and external ear normal. A middle ear effusion is present.     Left Ear: Hearing, ear canal and external ear normal. A middle ear effusion is present.     Nose: Congestion present.     Mouth/Throat:     Mouth: Mucous membranes are moist.     Pharynx: Oropharynx is clear.  Eyes:     General: No scleral icterus.       Right eye: No discharge.        Left eye: No discharge.     Conjunctiva/sclera: Conjunctivae normal.  Cardiovascular:     Rate and Rhythm: Normal rate and regular rhythm.     Heart sounds: Normal heart sounds.  Pulmonary:     Effort: Pulmonary effort is normal. No respiratory distress.     Breath sounds: Normal breath sounds. No wheezing, rhonchi or rales.  Musculoskeletal:     Cervical back: Neck supple.  Skin:    General: Skin is dry.  Neurological:     General: No focal deficit present.     Mental Status: She is alert. Mental status is at baseline.     Motor:  No weakness.     Gait: Gait normal.  Psychiatric:        Mood and Affect: Mood normal.        Behavior: Behavior normal.        Thought  Content: Thought content normal.      UC Treatments / Results  Labs (all labs ordered are listed, but only abnormal results are displayed) Labs Reviewed - No data to display  EKG   Radiology No results found.  Procedures Procedures (including critical care time)  Medications Ordered in UC Medications - No data to display  Initial Impression / Assessment and Plan / UC Course  I have reviewed the triage vital signs and the nursing notes.  Pertinent labs & imaging results that were available during my care of the patient were reviewed by me and considered in my medical decision making (see chart for details).  51 year old female presenting for 8-day history of cough and congestion.  Also reports bilateral ear pressure, sinus pressure and postnasal drainage.  Cough worse at night.  No associated fever or breathing difficulty.  Symptoms have improved from onset.  Vitals are normal and stable and she is overall well-appearing.  On exam she does have clear effusions of bilateral TMs without erythema or bulging, nasal congestion.  Chest clear to auscultation heart regular rate and rhythm.  Advised patient symptoms still consistent with viral URI.  Supportive care encouraged.  Sent Promethazine DM for her to take at nighttime and reassured her that sometimes cold can last longer than a few days to a week.  I expect she should be feeling better in the next few days.  Reviewed returning if fever or worsening symptoms.   Final Clinical Impressions(s) / UC Diagnoses   Final diagnoses:  Viral upper respiratory tract infection  Acute cough     Discharge Instructions      URI/COLD SYMPTOMS: Your exam today is consistent with a viral illness. Antibiotics are not indicated at this time. Use medications as directed, including cough syrup, nasal saline, and decongestants. Your symptoms should improve over the next few days and resolve within 7-10 days. Increase rest and fluids. F/u if symptoms  worsen or predominate such as sore throat, ear pain, productive cough, shortness of breath, or if you develop high fevers or worsening fatigue over the next several days.    Return if fever, breathing difficulty, increased sinus pain or if you are still not feeling better after another week.     ED Prescriptions     Medication Sig Dispense Auth. Provider   promethazine-dextromethorphan (PROMETHAZINE-DM) 6.25-15 MG/5ML syrup Take 5 mLs by mouth at bedtime as needed. 118 mL Shirlee Latch, PA-C      PDMP not reviewed this encounter.   Shirlee Latch, PA-C 11/30/21 1652

## 2021-11-30 NOTE — ED Triage Notes (Signed)
Pt c/o cold starting on 11/23/21. Pt is having cough, congestion that gets worse at night, nasal congestion, chest congestion.   Pt has been taking alkaseltzer and Nyquil

## 2021-11-30 NOTE — Discharge Instructions (Addendum)
URI/COLD SYMPTOMS: Your exam today is consistent with a viral illness. Antibiotics are not indicated at this time. Use medications as directed, including cough syrup, nasal saline, and decongestants. Your symptoms should improve over the next few days and resolve within 7-10 days. Increase rest and fluids. F/u if symptoms worsen or predominate such as sore throat, ear pain, productive cough, shortness of breath, or if you develop high fevers or worsening fatigue over the next several days.    Return if fever, breathing difficulty, increased sinus pain or if you are still not feeling better after another week.

## 2022-02-15 ENCOUNTER — Encounter: Payer: Self-pay | Admitting: Gastroenterology

## 2022-03-24 ENCOUNTER — Ambulatory Visit (INDEPENDENT_AMBULATORY_CARE_PROVIDER_SITE_OTHER): Admitting: Gastroenterology

## 2022-03-24 ENCOUNTER — Encounter: Payer: Self-pay | Admitting: Gastroenterology

## 2022-03-24 VITALS — BP 132/68 | HR 82 | Ht 62.0 in | Wt 130.4 lb

## 2022-03-24 DIAGNOSIS — K594 Anal spasm: Secondary | ICD-10-CM | POA: Diagnosis not present

## 2022-03-24 MED ORDER — AMBULATORY NON FORMULARY MEDICATION: 0 refills | Status: AC

## 2022-03-24 NOTE — Patient Instructions (Signed)
_______________________________________________________  If you are age 50 or older, your body mass index should be between 23-30. Your Body mass index is 23.85 kg/m. If this is out of the aforementioned range listed, please consider follow up with your Primary Care Provider.  If you are age 25 or younger, your body mass index should be between 19-25. Your Body mass index is 23.85 kg/m. If this is out of the aformentioned range listed, please consider follow up with your Primary Care Provider.   We have sent a prescription for nitroglycerin 0.125% gel to [facility]. You should apply a pea size amount to your rectum three times daily x 6-8 weeks.  [facility]'s information is below: Address: [address], [place], [place] [address]  [phone]  *Please DO NOT go directly from our office to pick up this medication! Give the pharmacy 1 day to process the prescription as this is compounded and takes time to make.   The [place] [facility] providers would like to encourage you to use [facility] to communicate with providers for non-urgent requests or questions.  Due to long hold times on the telephone, sending your provider a message by [facility] may be a faster and more efficient way to get a response.  Please allow 48 business hours for a response.  Please remember that this is for non-urgent requests.   It was a pleasure to see you today!  Thank you for trusting me with your gastrointestinal care!

## 2022-03-24 NOTE — Progress Notes (Signed)
HPI : Jaslen Adcox is a very pleasant 51 year old female with no significant past medical history who is referred to [place] by Dr. Katharine Look Rivard for further evaluation of episodic rectal pain.  Patient states that she first had this pain a couple years ago.  The pain episodes are infrequent and have occurred maybe 2 or 3 times in the past year.  She has not had an episode in several months.  The pain only occurs at night, and awakens her from sleep.  It is described as a very sharp, throbbing sudden pain in her pelvis/rectum.  Typically last for a few minutes and then goes away.  She does not have the urge to defecate when this happens, but she often feels like she should try, and sometimes passing a small amount of stool seems to help a little bit.  She has used CBD oil around the anus which she thinks has helped.  She denies any other chronic GI symptoms.  She has regular bowel movements, typically every 1 to 2 days.  Her stools are sometimes hard.  She has seen blood off and on for many years, usually in the setting of hard stools.  No problems with diarrhea, urgency or tenesmus. She has gained about 20 pounds in the past few months which she attributes to dietary indiscretion and late night snacking. She had a colonoscopy at [facility] in 2021 which she says was normal.  She was recommended to repeat colonoscopy in 10 years.    Past Medical History:  Diagnosis Date   Allergy    Chicken pox    Depression    no medications in the past     No past surgical history on file. Family History  Problem Relation Age of Onset   Cancer Mother 63       breast cancer   Cancer Father        prostate   Social History   Tobacco Use   Smoking status: Never   Smokeless tobacco: Never  Vaping Use   Vaping Use: Never used  Substance Use Topics   Alcohol use: No   Drug use: No   Current Outpatient Medications  Medication Sig Dispense Refill   clobetasol ointment (TEMOVATE) 8.29 % Apply 1  application topically 2 (two) times daily. (Patient not taking: Reported on 03/24/2022) 120 g 0   fluocinonide-emollient (LIDEX-E) 0.05 % cream Apply 1 application topically 2 (two) times daily. (Patient not taking: Reported on 03/24/2022) 60 g 0   hydrOXYzine (ATARAX/VISTARIL) 25 MG tablet Take 1 tablet (25 mg total) by mouth every 6 (six) hours. (Patient not taking: Reported on 03/24/2022) 12 tablet 0   methocarbamol (ROBAXIN) 500 MG tablet Take 1 tablet (500 mg total) by mouth 2 (two) times daily. (Patient not taking: Reported on 03/24/2022) 20 tablet 0   naproxen (NAPROSYN) 500 MG tablet Take 1 tablet (500 mg total) by mouth 2 (two) times daily with a meal. (Patient not taking: Reported on 03/24/2022) 60 tablet 0   naproxen (NAPROSYN) 500 MG tablet Take 1 tablet (500 mg total) by mouth 2 (two) times daily. (Patient not taking: Reported on 03/24/2022) 30 tablet 0   naproxen sodium (ANAPROX) 220 MG tablet Take 220 mg by mouth 2 (two) times daily with a meal. (Patient not taking: Reported on 03/24/2022)     predniSONE (DELTASONE) 20 MG tablet Take 3 PO QAM x3days, 2 PO QAM x3days, 1 PO QAM x3days (Patient not taking: Reported on 03/24/2022) 18 tablet 0  promethazine-dextromethorphan (PROMETHAZINE-DM) 6.25-15 MG/5ML syrup Take 5 mLs by mouth at bedtime as needed. (Patient not taking: Reported on 03/24/2022) 118 mL 0   tiZANidine (ZANAFLEX) 2 MG tablet Take 1 tablet (2 mg total) by mouth at bedtime. (Patient not taking: Reported on 03/24/2022) 10 tablet 0   No current facility-administered medications for this visit.   Allergies  Allergen Reactions   Latex Rash     Review of Systems: All systems reviewed and negative except where noted in HPI.    No results found.  Physical Exam: BP 132/68   Pulse 82   Ht 5\' 2"  (1.575 m)   Wt 130 lb 6 oz (59.1 kg)   BMI 23.85 kg/m  Constitutional: Pleasant,well-developed, African-American female in no acute distress. HEENT: Normocephalic and  atraumatic. Conjunctivae are normal. No scleral icterus. Neck supple.  Neurological: Alert and oriented to person place and time. Skin: Skin is warm and dry. No rashes noted. Psychiatric: Normal mood and affect. Behavior is normal.  CBC    Component Value Date/Time   WBC 4.3 06/19/2007 1102   RBC 4.31 06/19/2007 1102   HGB 14.5 06/19/2007 1102   HCT 40.6 06/19/2007 1102   PLT 258 06/19/2007 1102   MCV 94.1 06/19/2007 1102   MCHC 35.8 06/19/2007 1102   RDW 13.4 06/19/2007 1102    CMP     Component Value Date/Time   NA 140 03/19/2014 0801   K 4.6 03/19/2014 0801   CL 105 03/19/2014 0801   CO2 22 03/19/2014 0801   GLUCOSE 85 03/19/2014 0801   BUN 11 03/19/2014 0801   CREATININE 0.7 03/19/2014 0801   CALCIUM 9.1 03/19/2014 0801     ASSESSMENT AND PLAN: 51 year old female with 1+ year of infrequent rectal spasm episodes.  The history of these random, sudden attacks of rectal pain which resolved within a few minutes at night is most consistent with proctalgia fugax.  The patient has had a colonoscopy recently which was unremarkable.  No need for further evaluation.  I provided reassurance on the benign nature of this condition.  Although I did not recommend taking medications given the very infrequent occurrence of the symptoms, the patient did request something if she were to have another episode. I recommended she try taking intra-anal nitroglycerin for her next episode.  Also suggested soaking in a warm bath or sitting on a tennis ball, which has had some evidence of success.  Proctalgia fugax - Education, reassurance - Intra-anal nitroglycerin 0.125% as needed  Shalita Notte E. 45, MD [facility]  Rivard, Tomasa Rand, MD

## 2022-10-19 LAB — HM MAMMOGRAPHY

## 2022-10-21 ENCOUNTER — Other Ambulatory Visit: Payer: Self-pay | Admitting: Obstetrics and Gynecology

## 2022-10-21 DIAGNOSIS — R928 Other abnormal and inconclusive findings on diagnostic imaging of breast: Secondary | ICD-10-CM

## 2022-11-02 ENCOUNTER — Encounter: Payer: Self-pay | Admitting: Obstetrics and Gynecology

## 2022-11-15 ENCOUNTER — Ambulatory Visit

## 2022-11-15 ENCOUNTER — Ambulatory Visit
Admission: RE | Admit: 2022-11-15 | Discharge: 2022-11-15 | Disposition: A | Discharge status: Home / Self Care | Source: Ambulatory Visit | Attending: Obstetrics and Gynecology | Admitting: Obstetrics and Gynecology

## 2022-11-15 DIAGNOSIS — R928 Other abnormal and inconclusive findings on diagnostic imaging of breast: Secondary | ICD-10-CM

## 2023-05-02 ENCOUNTER — Ambulatory Visit: Admitting: Family Medicine

## 2023-05-02 ENCOUNTER — Encounter: Payer: Self-pay | Admitting: Family Medicine

## 2023-05-02 VITALS — BP 122/84 | HR 82 | Temp 98.2°F | Ht 62.0 in | Wt 147.6 lb

## 2023-05-02 DIAGNOSIS — M1990 Unspecified osteoarthritis, unspecified site: Secondary | ICD-10-CM | POA: Diagnosis not present

## 2023-05-02 DIAGNOSIS — Z7689 Persons encountering health services in other specified circumstances: Secondary | ICD-10-CM

## 2023-05-02 DIAGNOSIS — Z91018 Allergy to other foods: Secondary | ICD-10-CM

## 2023-05-02 DIAGNOSIS — N951 Menopausal and female climacteric states: Secondary | ICD-10-CM | POA: Diagnosis not present

## 2023-05-02 DIAGNOSIS — L309 Dermatitis, unspecified: Secondary | ICD-10-CM

## 2023-05-02 MED ORDER — EPINEPHRINE 0.3 MG/0.3ML IJ SOAJ
0.3000 mg | INTRAMUSCULAR | 1 refills | Status: AC | PRN
Start: 2023-05-02 — End: ?
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
## 2023-06-08 ENCOUNTER — Encounter: Admitting: Family Medicine

## 2023-08-11 ENCOUNTER — Other Ambulatory Visit: Payer: Self-pay | Admitting: Obstetrics and Gynecology

## 2023-08-11 DIAGNOSIS — Z1231 Encounter for screening mammogram for malignant neoplasm of breast: Secondary | ICD-10-CM

## 2023-08-25 ENCOUNTER — Other Ambulatory Visit: Payer: Self-pay | Admitting: Obstetrics and Gynecology

## 2023-08-25 DIAGNOSIS — N6002 Solitary cyst of left breast: Secondary | ICD-10-CM

## 2023-08-25 DIAGNOSIS — N644 Mastodynia: Secondary | ICD-10-CM

## 2023-09-28 ENCOUNTER — Ambulatory Visit
Admission: RE | Admit: 2023-09-28 | Discharge: 2023-09-28 | Disposition: A | Discharge status: Home / Self Care | Source: Ambulatory Visit | Attending: Obstetrics and Gynecology | Admitting: Obstetrics and Gynecology

## 2023-09-28 DIAGNOSIS — N644 Mastodynia: Secondary | ICD-10-CM

## 2023-09-28 DIAGNOSIS — N6002 Solitary cyst of left breast: Secondary | ICD-10-CM

## 2023-10-27 ENCOUNTER — Other Ambulatory Visit: Payer: Self-pay | Admitting: Obstetrics and Gynecology

## 2023-10-27 DIAGNOSIS — Z1231 Encounter for screening mammogram for malignant neoplasm of breast: Secondary | ICD-10-CM

## 2023-11-02 ENCOUNTER — Other Ambulatory Visit: Payer: Self-pay | Admitting: Obstetrics and Gynecology

## 2023-11-02 ENCOUNTER — Ambulatory Visit
Admission: RE | Admit: 2023-11-02 | Discharge: 2023-11-02 | Disposition: A | Discharge status: Home / Self Care | Source: Ambulatory Visit | Attending: Obstetrics and Gynecology | Admitting: Obstetrics and Gynecology

## 2023-11-02 DIAGNOSIS — Z1231 Encounter for screening mammogram for malignant neoplasm of breast: Secondary | ICD-10-CM

## 2023-12-14 IMAGING — MG MM DIGITAL DIAGNOSTIC UNILAT*R* W/ TOMO W/ CAD
8 series · 8 of 24 positions shown · non-contrast
Comparison: Previous exam(s).

CLINICAL DATA: The patient was called back for a right breast
asymmetry.

EXAM:
DIGITAL DIAGNOSTIC UNILATERAL RIGHT MAMMOGRAM WITH TOMOSYNTHESIS AND
CAD
TECHNIQUE: Right digital diagnostic mammography and breast tomosynthesis was
performed. The images were evaluated with computer-aided detection.

[R ML synth-2D]
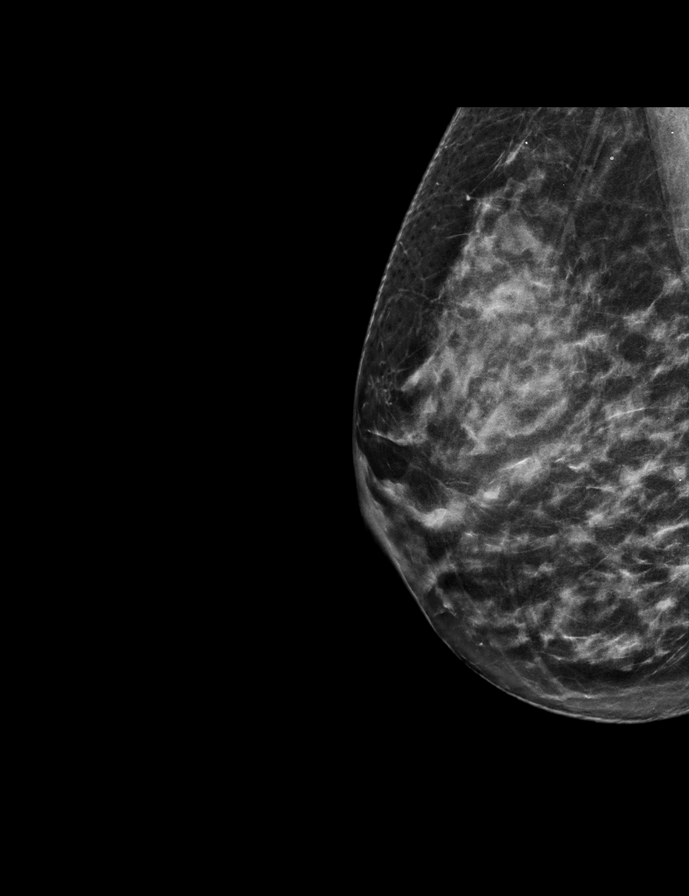

[R XCCL synth-2D]
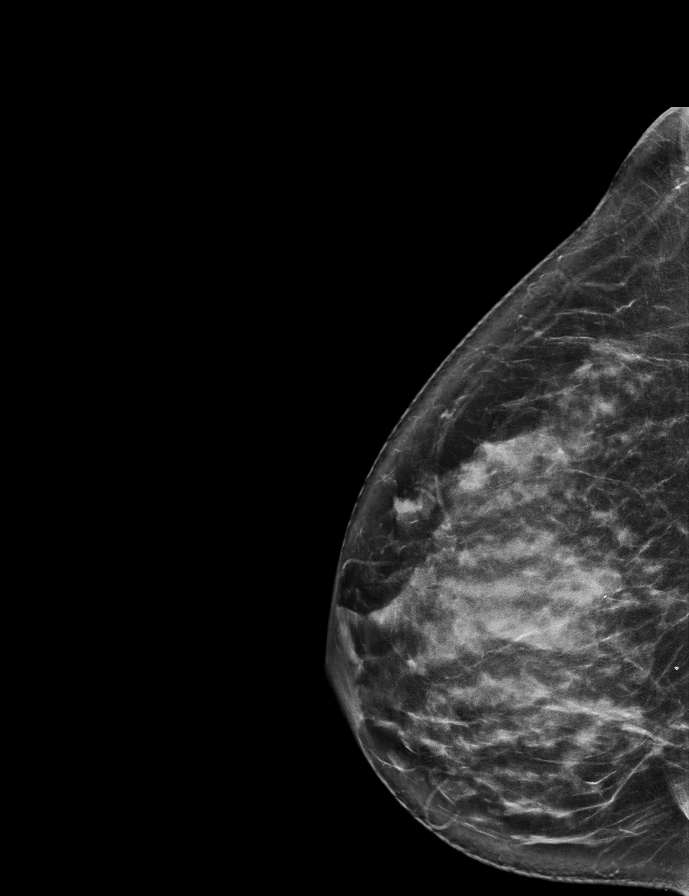

[R MLO synth-2D (1 of 2)]
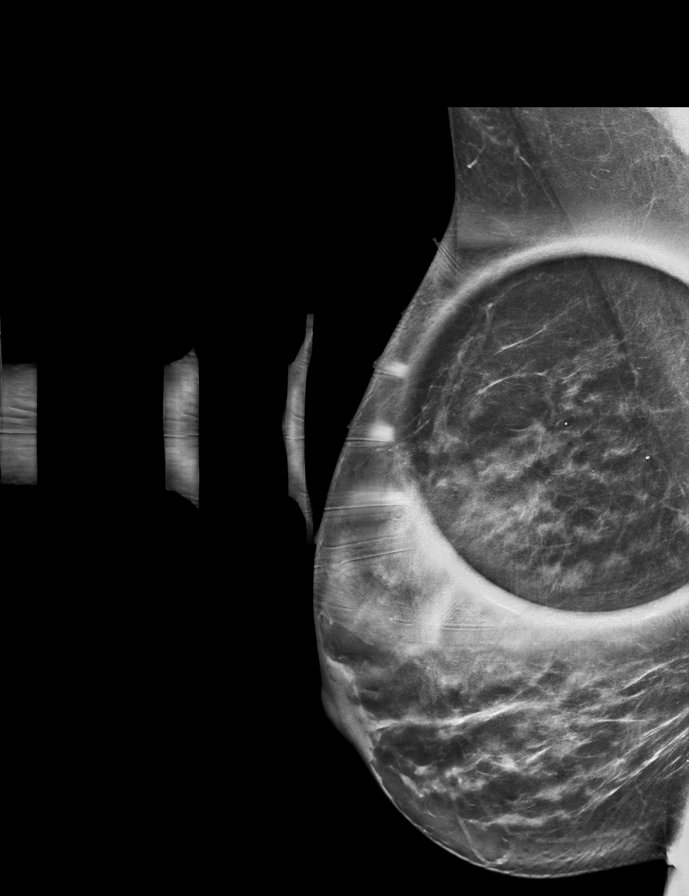

[R MLO synth-2D (2 of 2)]
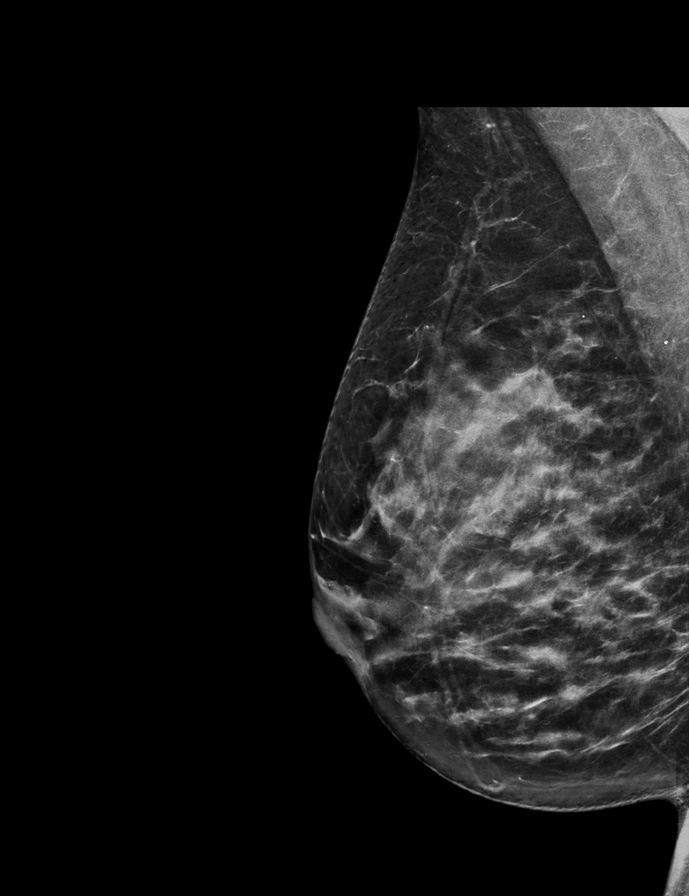

[R ML tomo · tomo slice 31/61.0]
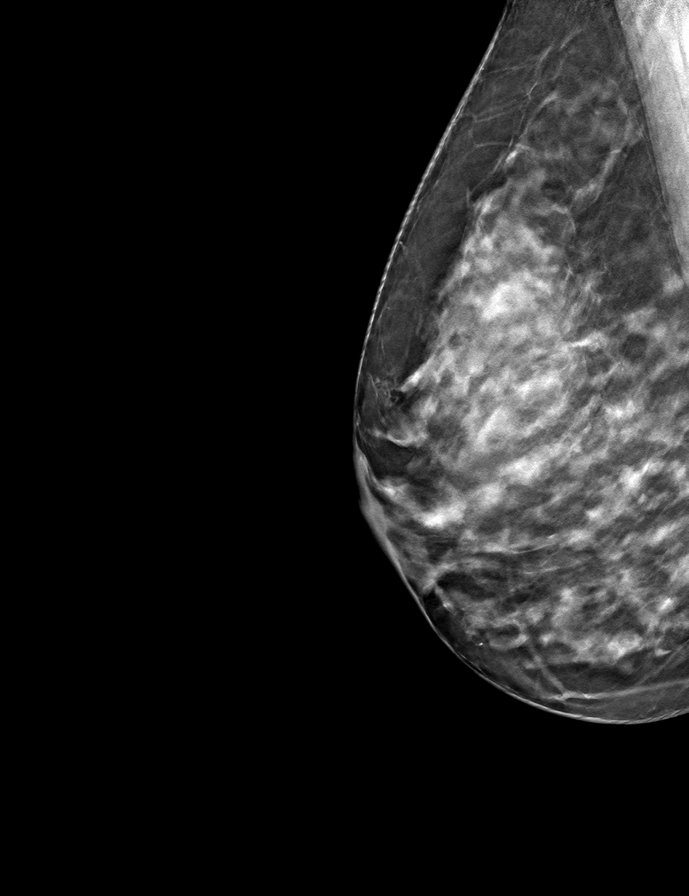

[R MLO tomo (1 of 2) · tomo slice 33/65.0]
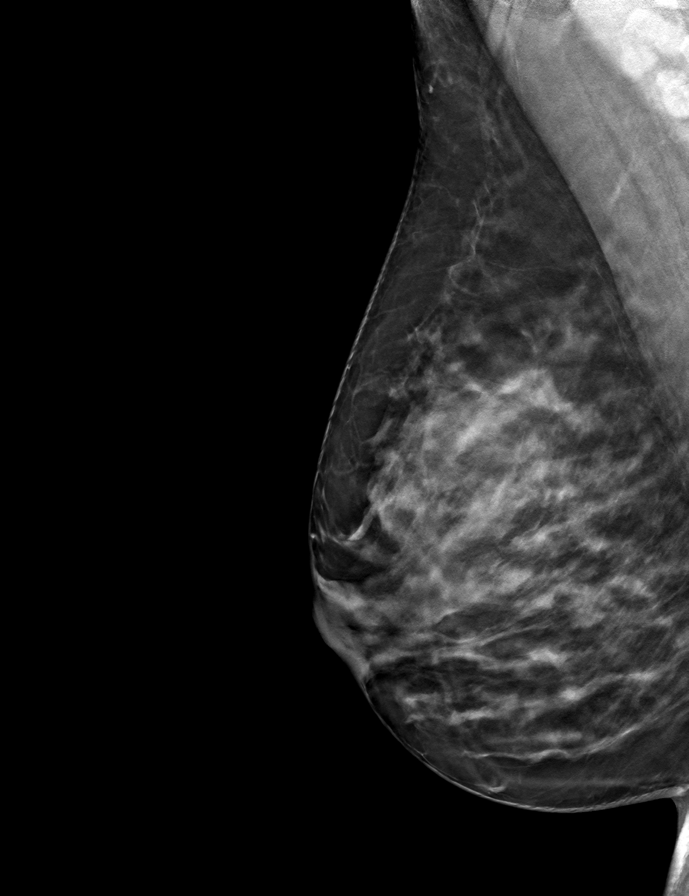

[R XCCL tomo · tomo slice 36/71.0]
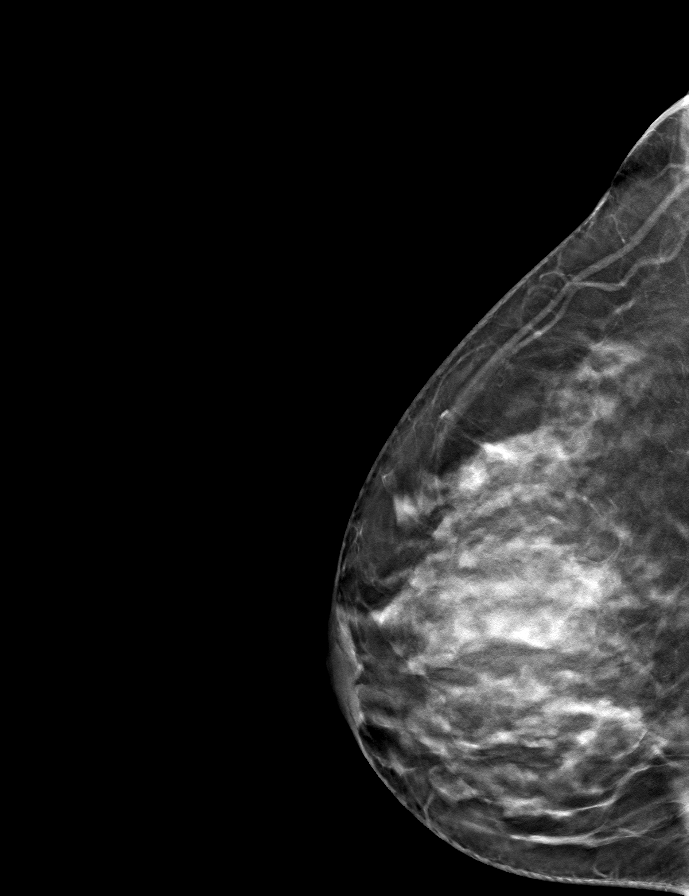

[R MLO tomo (2 of 2) · tomo slice 33/66.0]
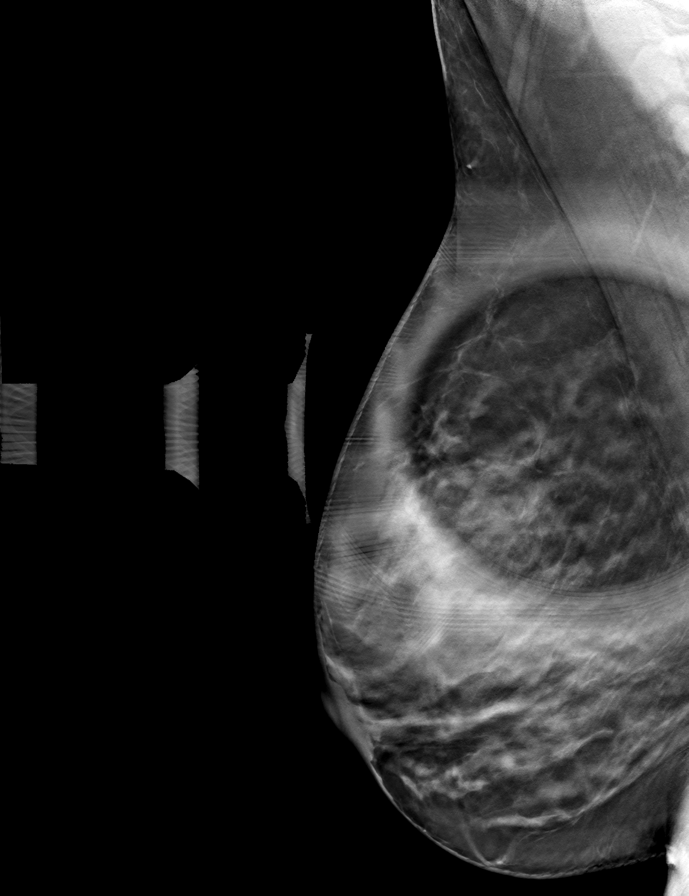

[8 of 24 positions shown; findings below may reference images not displayed]

ACR Breast Density Category c: The breast tissue is heterogeneously
dense, which may obscure small masses.
FINDINGS: The right breast asymmetry resolves on additional imaging.
IMPRESSION: No mammographic evidence of malignancy.

RECOMMENDATION:
Annual screening mammography.

The patient's mother was diagnosed with breast before the age of 45.
Recommend consultation with a genetic counselor. If the patient is
found to be high risk with a genetic mutation or a calculated
lifetime risk of greater than 20%, recommend annual mammography and
annual breast MRI. Also, if the patient is found to be high risk,
recommend assessing the patient to determine whether she would
benefit from antiestrogen therapy. If the patient is not high risk,
recommend annual screening mammography.

I have discussed the findings and recommendations with the patient.
If applicable, a reminder letter will be sent to the patient
regarding the next appointment.

BI-RADS CATEGORY  1: Negative.
